# Patient Record
Sex: Male | Born: 2005 | Race: Black or African American | Hispanic: No | Marital: Single | Smoking: Never smoker
Health system: Southern US, Community
[De-identification: ages and names within clinical notes are randomized; demographics above are authoritative.]

## PROBLEM LIST (undated history)

## (undated) DIAGNOSIS — S83209A Unspecified tear of unspecified meniscus, current injury, unspecified knee, initial encounter: Secondary | ICD-10-CM

---
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
## 2024-02-10 ENCOUNTER — Ambulatory Visit: Payer: Self-pay | Admitting: Family Medicine
